# Patient Record
Sex: Female | Born: 1955 | Race: Black or African American | Hispanic: No | Marital: Single | Smoking: Never smoker
Health system: Southern US, Community
[De-identification: ages and names within clinical notes are randomized; demographics above are authoritative.]

## PROBLEM LIST (undated history)

## (undated) DIAGNOSIS — I10 Essential (primary) hypertension: Secondary | ICD-10-CM

## (undated) DIAGNOSIS — E119 Type 2 diabetes mellitus without complications: Secondary | ICD-10-CM

## (undated) DIAGNOSIS — E785 Hyperlipidemia, unspecified: Secondary | ICD-10-CM

## (undated) HISTORY — PX: ABDOMINAL HYSTERECTOMY: SHX81

---

## 2004-04-02 ENCOUNTER — Other Ambulatory Visit: Payer: Self-pay

## 2005-11-11 ENCOUNTER — Ambulatory Visit: Payer: Self-pay | Admitting: Family Medicine

## 2006-07-29 ENCOUNTER — Ambulatory Visit: Payer: Self-pay | Admitting: Family Medicine

## 2007-05-01 ENCOUNTER — Emergency Department: Payer: Self-pay | Admitting: Unknown Physician Specialty

## 2007-05-01 ENCOUNTER — Other Ambulatory Visit: Payer: Self-pay

## 2007-12-07 ENCOUNTER — Emergency Department: Payer: Self-pay | Admitting: Emergency Medicine

## 2008-07-24 ENCOUNTER — Emergency Department: Payer: Self-pay | Admitting: Emergency Medicine

## 2009-04-15 ENCOUNTER — Emergency Department: Payer: Self-pay | Admitting: Emergency Medicine

## 2010-01-01 ENCOUNTER — Emergency Department: Payer: Self-pay | Admitting: Emergency Medicine

## 2011-09-05 ENCOUNTER — Emergency Department: Payer: Self-pay | Admitting: Emergency Medicine

## 2011-09-05 LAB — BASIC METABOLIC PANEL
Anion Gap: 14 (ref 7–16)
Calcium, Total: 8.5 mg/dL (ref 8.5–10.1)
Creatinine: 1.19 mg/dL (ref 0.60–1.30)
EGFR (African American): >60
EGFR (Non-African Amer.): 50 — ABNORMAL LOW
Glucose: 337 mg/dL — ABNORMAL HIGH (ref 65–99)
Osmolality: 297 (ref 275–301)
Sodium: 141 mmol/L (ref 136–145)

## 2011-09-05 LAB — CBC
MCH: 26 pg (ref 26.0–34.0)
MCV: 79 fL — ABNORMAL LOW (ref 80–100)
Platelet: 197 10*3/uL (ref 150–440)
RDW: 15.6 % — ABNORMAL HIGH (ref 11.5–14.5)
WBC: 7.1 10*3/uL (ref 3.6–11.0)

## 2011-09-05 LAB — URINALYSIS, COMPLETE
Bacteria: NONE SEEN
Bilirubin,UR: NEGATIVE
Blood: NEGATIVE
Glucose,UR: >=500 mg/dL (ref 0–75)
Leukocyte Esterase: NEGATIVE
Nitrite: NEGATIVE
Protein: NEGATIVE
RBC,UR: NONE SEEN /HPF (ref 0–5)
Squamous Epithelial: NONE SEEN

## 2011-09-05 LAB — MAGNESIUM: Magnesium: 1.5 mg/dL — ABNORMAL LOW

## 2012-04-04 ENCOUNTER — Ambulatory Visit: Payer: Self-pay | Admitting: Family Medicine

## 2012-08-29 ENCOUNTER — Emergency Department: Payer: Self-pay | Admitting: Emergency Medicine

## 2012-08-29 LAB — URINALYSIS, COMPLETE
Bacteria: NONE SEEN
Bilirubin,UR: NEGATIVE
Blood: NEGATIVE
Ketone: NEGATIVE
Ph: 5 (ref 4.5–8.0)
Protein: NEGATIVE
RBC,UR: <1 /HPF (ref 0–5)
Squamous Epithelial: 1
WBC UR: 1 /HPF (ref 0–5)

## 2013-04-21 ENCOUNTER — Emergency Department: Payer: Self-pay | Admitting: Emergency Medicine

## 2013-08-25 ENCOUNTER — Emergency Department: Payer: Self-pay | Admitting: Emergency Medicine

## 2013-08-27 ENCOUNTER — Ambulatory Visit: Payer: Self-pay | Admitting: Family Medicine

## 2015-05-22 ENCOUNTER — Other Ambulatory Visit: Payer: Self-pay | Admitting: Family Medicine

## 2015-05-22 DIAGNOSIS — Z1231 Encounter for screening mammogram for malignant neoplasm of breast: Secondary | ICD-10-CM

## 2015-06-10 ENCOUNTER — Ambulatory Visit
Admission: RE | Admit: 2015-06-10 | Discharge: 2015-06-10 | Disposition: A | Discharge status: Home / Self Care | Payer: Medicare Other | Source: Ambulatory Visit | Attending: Family Medicine | Admitting: Family Medicine

## 2015-06-10 ENCOUNTER — Other Ambulatory Visit: Payer: Self-pay | Admitting: Family Medicine

## 2015-06-10 DIAGNOSIS — Z1231 Encounter for screening mammogram for malignant neoplasm of breast: Secondary | ICD-10-CM | POA: Insufficient documentation

## 2016-06-29 ENCOUNTER — Other Ambulatory Visit: Payer: Self-pay | Admitting: Family Medicine

## 2016-06-29 DIAGNOSIS — Z1231 Encounter for screening mammogram for malignant neoplasm of breast: Secondary | ICD-10-CM

## 2016-07-12 ENCOUNTER — Ambulatory Visit: Payer: Medicare Other | Attending: Family Medicine

## 2016-11-26 ENCOUNTER — Ambulatory Visit
Admission: RE | Admit: 2016-11-26 | Discharge: 2016-11-26 | Disposition: A | Discharge status: Home / Self Care | Payer: Medicare Other | Source: Ambulatory Visit | Attending: Family Medicine | Admitting: Family Medicine

## 2016-11-26 DIAGNOSIS — Z1231 Encounter for screening mammogram for malignant neoplasm of breast: Secondary | ICD-10-CM | POA: Insufficient documentation

## 2017-12-14 ENCOUNTER — Other Ambulatory Visit: Payer: Self-pay | Admitting: Family Medicine

## 2017-12-14 DIAGNOSIS — Z1231 Encounter for screening mammogram for malignant neoplasm of breast: Secondary | ICD-10-CM

## 2018-01-14 ENCOUNTER — Emergency Department
Admission: EM | Admit: 2018-01-14 | Discharge: 2018-01-14 | Disposition: A | Discharge status: Home / Self Care | Payer: Medicare HMO | Attending: Emergency Medicine | Admitting: Emergency Medicine

## 2018-01-14 ENCOUNTER — Other Ambulatory Visit: Payer: Self-pay

## 2018-01-14 ENCOUNTER — Encounter: Payer: Self-pay | Admitting: Emergency Medicine

## 2018-01-14 DIAGNOSIS — I10 Essential (primary) hypertension: Secondary | ICD-10-CM | POA: Diagnosis not present

## 2018-01-14 DIAGNOSIS — S0501XA Injury of conjunctiva and corneal abrasion without foreign body, right eye, initial encounter: Secondary | ICD-10-CM | POA: Diagnosis not present

## 2018-01-14 DIAGNOSIS — S0591XA Unspecified injury of right eye and orbit, initial encounter: Secondary | ICD-10-CM | POA: Diagnosis present

## 2018-01-14 DIAGNOSIS — E119 Type 2 diabetes mellitus without complications: Secondary | ICD-10-CM | POA: Insufficient documentation

## 2018-01-14 DIAGNOSIS — Y929 Unspecified place or not applicable: Secondary | ICD-10-CM | POA: Diagnosis not present

## 2018-01-14 DIAGNOSIS — Y999 Unspecified external cause status: Secondary | ICD-10-CM | POA: Insufficient documentation

## 2018-01-14 DIAGNOSIS — X58XXXA Exposure to other specified factors, initial encounter: Secondary | ICD-10-CM | POA: Diagnosis not present

## 2018-01-14 DIAGNOSIS — Y939 Activity, unspecified: Secondary | ICD-10-CM | POA: Diagnosis not present

## 2018-01-14 HISTORY — DX: Type 2 diabetes mellitus without complications: E11.9

## 2018-01-14 HISTORY — DX: Essential (primary) hypertension: I10

## 2018-01-14 HISTORY — DX: Hyperlipidemia, unspecified: E78.5

## 2018-01-14 MED ORDER — TOBRAMYCIN 0.3 % OP SOLN: 2.0000 [drp] | OPHTHALMIC | 0 refills | Status: DC

## 2018-01-14 MED ORDER — FLUORESCEIN SODIUM 1 MG OP STRP
1.0000 | ORAL_STRIP | Freq: Once | OPHTHALMIC | Status: DC
  Filled 2018-01-14: qty 1

## 2018-01-14 MED ORDER — TOBRAMYCIN 0.3 % OP SOLN: 2.0000 [drp] | OPHTHALMIC | 0 refills | Status: AC

## 2018-01-14 MED ORDER — TETRACAINE HCL 0.5 % OP SOLN
1.0000 [drp] | Freq: Once | OPHTHALMIC | Status: DC
  Filled 2018-01-14: qty 4

## 2018-01-14 NOTE — ED Triage Notes (Signed)
Pt to ED via POV pt states that she accidentally hit herself in the right eye on Wednesday with a piece of cardboard. Yesterday pt started having pain and redness in her eye. Pt states that eye is sensitive to light. Pt is in NAD at this time.

## 2018-01-14 NOTE — ED Notes (Signed)
Pt states swatted at a bug that landed near her eye and scratched her outer lid a few days ago. Now her eye is red and painful

## 2018-01-14 NOTE — ED Notes (Signed)
Pt not present in room when attempting to obtain discharge vitals.

## 2018-01-14 NOTE — Discharge Instructions (Addendum)
Begin using tobramycin ophthalmic solution to your right eye every 4 hours while awake.  You may take Tylenol if needed for eye pain.  Follow-up with Dr. Sharman CrateBrassington if any continued problems.  Call their office on Monday for an appointment.

## 2018-01-14 NOTE — ED Notes (Signed)
Pt ambulatory upon discharge; declined wheel chair. Verbalized understanding of discharge instructions, follow-up care and prescription. VSS. Skin warm and dry. A&O x4.  

## 2018-01-14 NOTE — ED Provider Notes (Signed)
[facility] Emergency Department Provider Note  ___________________________________________   First MD Initiated Contact with Patient 01/14/18 1422     (approximate)  I have reviewed the triage vital signs and the nursing notes.   HISTORY  Chief Complaint Eye Pain   HPI Melissa Gallagher is a 62 y.o. female is here with complaint of right eye pain.  Patient states that there was a bug that flew past her and she was using a piece of cardboard to fan herself.  Patient states she felt something in her eye.  She has continued to get some redness and now has some photophobia.  She rates her pain as 7 out of 10.  Past Medical History:  Diagnosis Date  . Diabetes mellitus without complication (HCC)   . Hyperlipemia   . Hypertension     There are no active problems to display for this patient.   Past Surgical History:  Procedure Laterality Date  . ABDOMINAL HYSTERECTOMY      Prior to Admission medications   Medication Sig Start Date End Date Taking? Authorizing Provider  tobramycin (TOBREX) 0.3 % ophthalmic solution Place 2 drops into the right eye every 4 (four) hours. While awake 01/14/18   Tommi Rumps, PA-C    Allergies Patient has no known allergies.  Family History  Problem Relation Age of Onset  . Breast cancer Cousin     Social History Social History   Tobacco Use  . Smoking status: Never Smoker  . Smokeless tobacco: Never Used  Substance Use Topics  . Alcohol use: Not Currently  . Drug use: Not Currently    Review of Systems Constitutional: No fever/chills Eyes: Right eye pain.  Positive for possible injury to right eye. ENT: No sore throat. Cardiovascular: Denies chest pain. Respiratory: Denies shortness of breath. Musculoskeletal: Negative for back pain. Skin: Negative for rash. Neurological: Negative for headaches, focal weakness or numbness. ____________________________________________   PHYSICAL EXAM:  VITAL  SIGNS: ED Triage Vitals  Enc Vitals Group     BP 01/14/18 1344 (!) 150/83     Pulse Rate 01/14/18 1344 86     Resp 01/14/18 1344 16     Temp 01/14/18 1344 98.6 F (37 C)     Temp Source 01/14/18 1344 Oral     SpO2 01/14/18 1344 99 %     Weight 01/14/18 1345 260 lb (117.9 kg)     Height 01/14/18 1345 5\' 7"  (1.702 m)     Head Circumference --      Peak Flow --      Pain Score 01/14/18 1344 7     Pain Loc --      Pain Edu? --      Excl. in GC? --    Constitutional: Alert and oriented. Well appearing and in no acute distress. Eye:  PERRL. EOMI. right conjunctiva is injected.  Left conjunctive a clear. Head: Atraumatic. Neck: No stridor.   Cardiovascular: Normal rate, regular rhythm. Grossly normal heart sounds.  Good peripheral circulation. Respiratory: Normal respiratory effort.  No retractions. Lungs CTAB. Neurologic:  Normal speech and language. No gross focal neurologic deficits are appreciated. No gait instability. Skin:  Skin is warm, dry and intact.  Psychiatric: Mood and affect are normal. Speech and behavior are normal.  ____________________________________________   LABS (all labs ordered are listed, but only abnormal results are displayed)  Labs Reviewed - No data to display  PROCEDURES  Procedure(s) performed: Tetracaine was placed to the right eye.  Upper lid was inverted and no foreign body was noted.  No foreign body is noted and EOMs are intact.  Fluorescein stain was placed and there is a superficial corneal abrasion present at approximately 6:00.  Visual acuity was done by nursing staff.  Procedures  Critical Care performed: No  ____________________________________________   INITIAL IMPRESSION / ASSESSMENT AND PLAN / ED COURSE  As part of my medical decision making, I reviewed the following data within the electronic MEDICAL RECORD NUMBER Notes from prior ED visits and [place] Controlled Substance Database  Patient is here with photophobia after swatting at a  bug with a piece of cardboard.  Exam reveals superficial corneal abrasion.  Patient was placed on Tobrex ophthalmic solution.  She is encouraged to follow-up with [facility] if any continued problems on Monday.  ____________________________________________   FINAL CLINICAL IMPRESSION(S) / ED DIAGNOSES  Final diagnoses:  Abrasion of right cornea, initial encounter     ED Discharge Orders        Ordered    tobramycin (TOBREX) 0.3 % ophthalmic solution  Every 4 hours,   Status:  Discontinued     01/14/18 1445    tobramycin (TOBREX) 0.3 % ophthalmic solution  Every 4 hours     01/14/18 1447       Note:  This document was prepared using Dragon voice recognition software and may include unintentional dictation errors.    Tommi RumpsSummers, Kaysia Willard L, PA-C 01/14/18 1450    Jene EveryKinner, Robert, MD 01/14/18 1454

## 2018-03-02 ENCOUNTER — Ambulatory Visit
Admission: RE | Admit: 2018-03-02 | Discharge: 2018-03-02 | Disposition: A | Discharge status: Home / Self Care | Payer: Medicare HMO | Source: Ambulatory Visit | Attending: Family Medicine | Admitting: Family Medicine

## 2018-03-02 DIAGNOSIS — Z1231 Encounter for screening mammogram for malignant neoplasm of breast: Secondary | ICD-10-CM | POA: Insufficient documentation

## 2019-04-26 ENCOUNTER — Other Ambulatory Visit: Payer: Self-pay | Admitting: Family Medicine

## 2019-04-26 DIAGNOSIS — Z1231 Encounter for screening mammogram for malignant neoplasm of breast: Secondary | ICD-10-CM

## 2019-06-04 ENCOUNTER — Ambulatory Visit
Admission: RE | Admit: 2019-06-04 | Discharge: 2019-06-04 | Disposition: A | Discharge status: Home / Self Care | Payer: Medicare HMO | Source: Ambulatory Visit | Attending: Family Medicine | Admitting: Family Medicine

## 2019-06-04 DIAGNOSIS — Z1231 Encounter for screening mammogram for malignant neoplasm of breast: Secondary | ICD-10-CM

## 2019-07-06 IMAGING — MG MM DIGITAL SCREENING BILAT W/ TOMO W/ CAD
8 series · 8 of 24 positions shown · non-contrast
Comparison: Previous exam(s).

CLINICAL DATA: Screening.

EXAM:
DIGITAL SCREENING BILATERAL MAMMOGRAM WITH TOMO AND CAD

[L CC synth-2D]
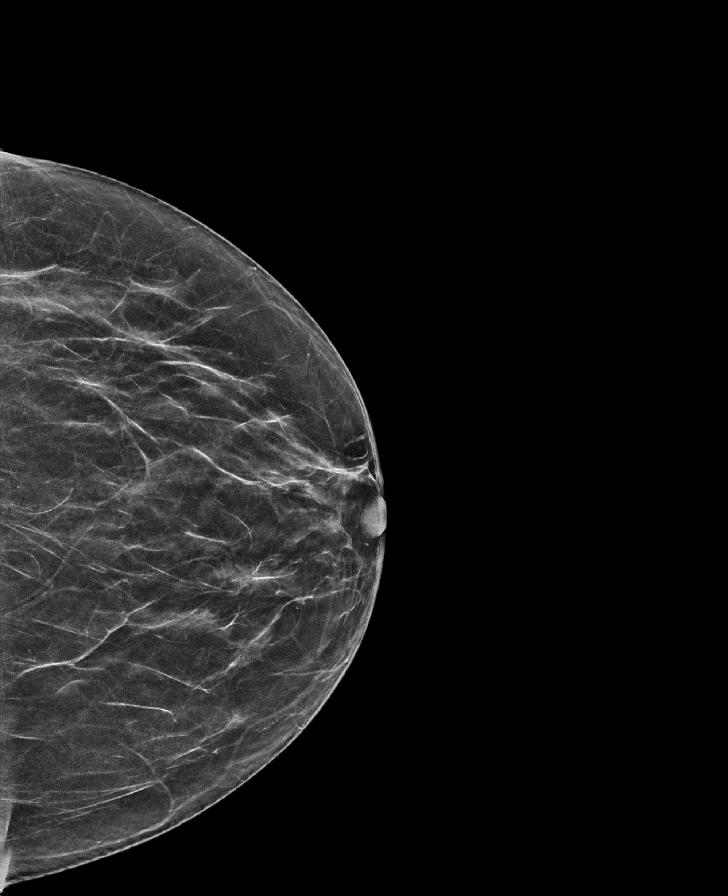

[R CC synth-2D]
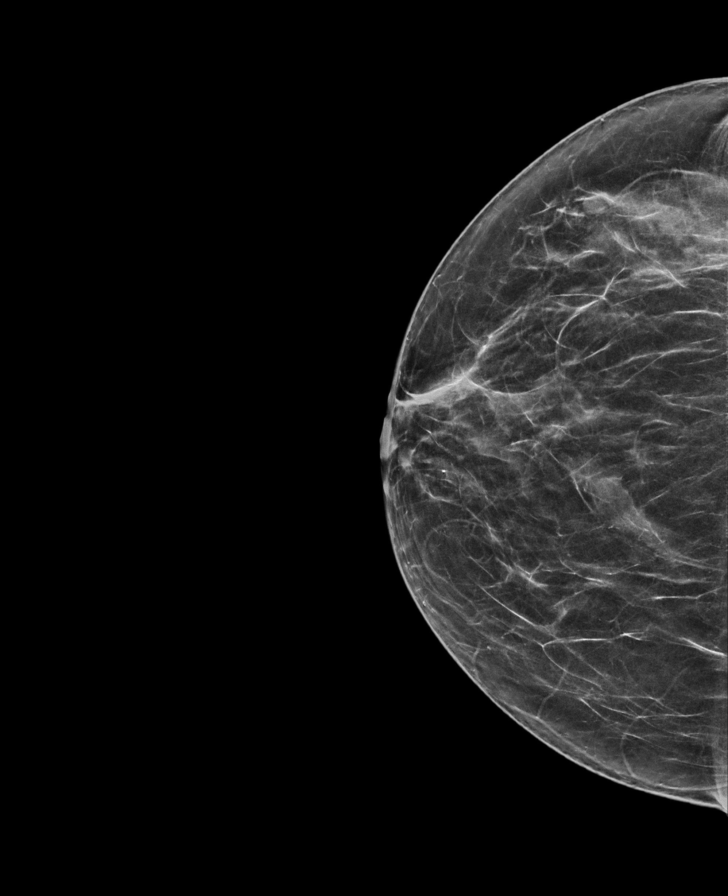

[L MLO synth-2D]
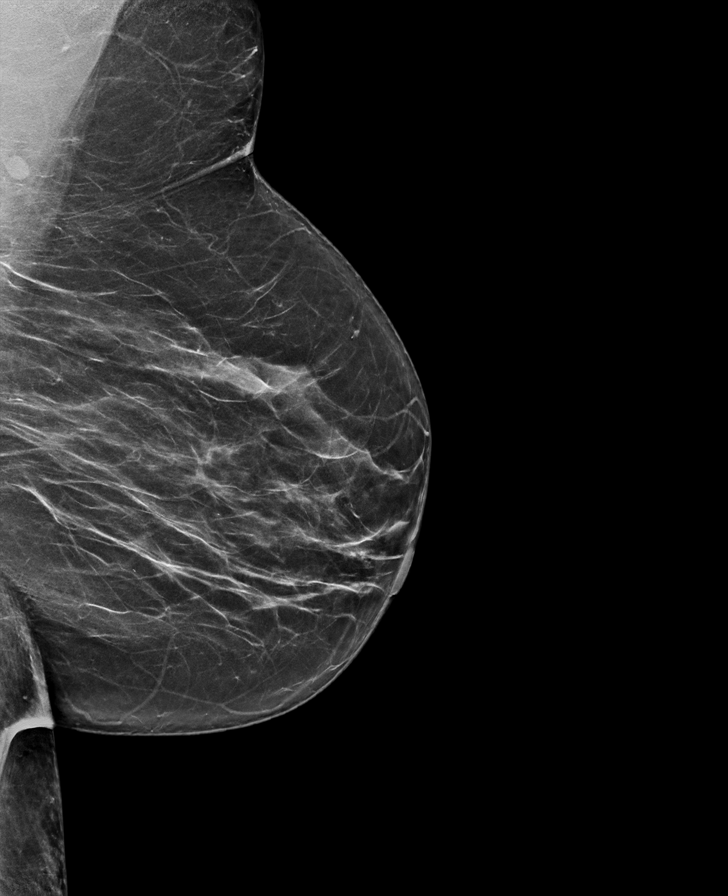

[R MLO synth-2D]
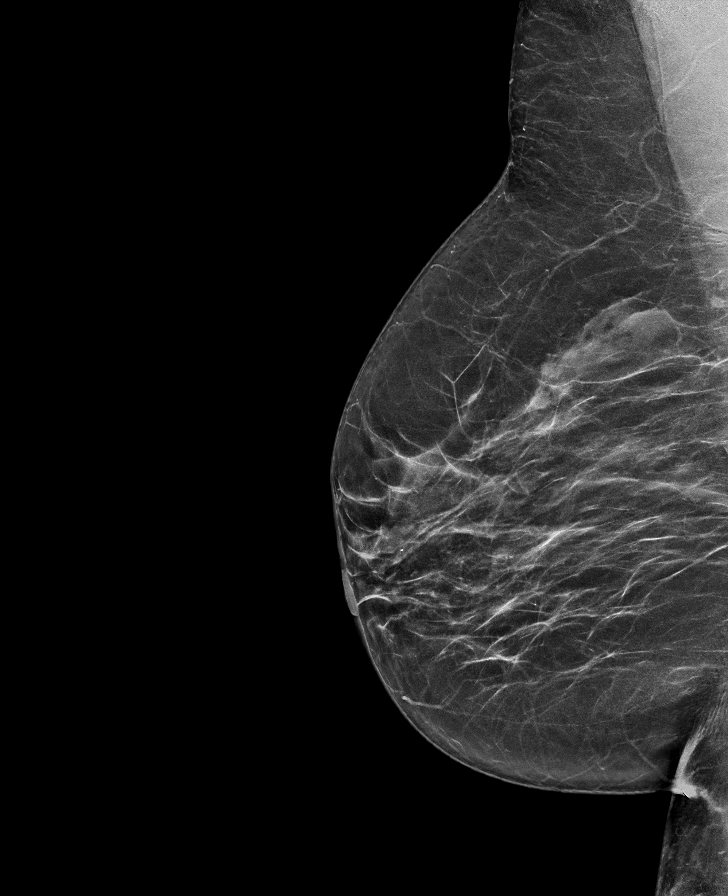

[L CC tomo · tomo slice 29/57.0]
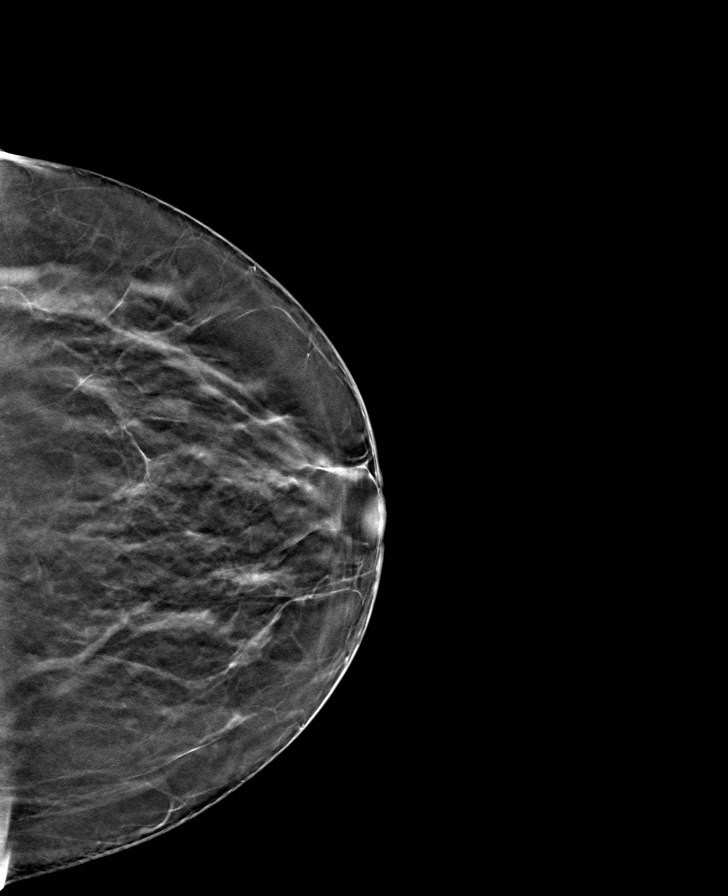

[R CC tomo · tomo slice 31/60.0]
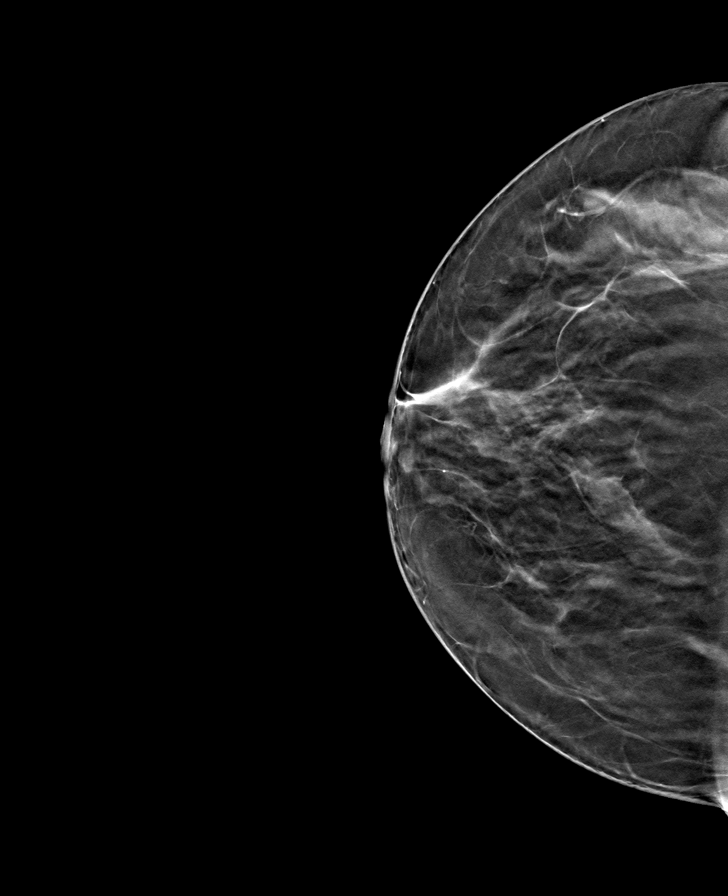

[L MLO tomo · tomo slice 35/68.0]
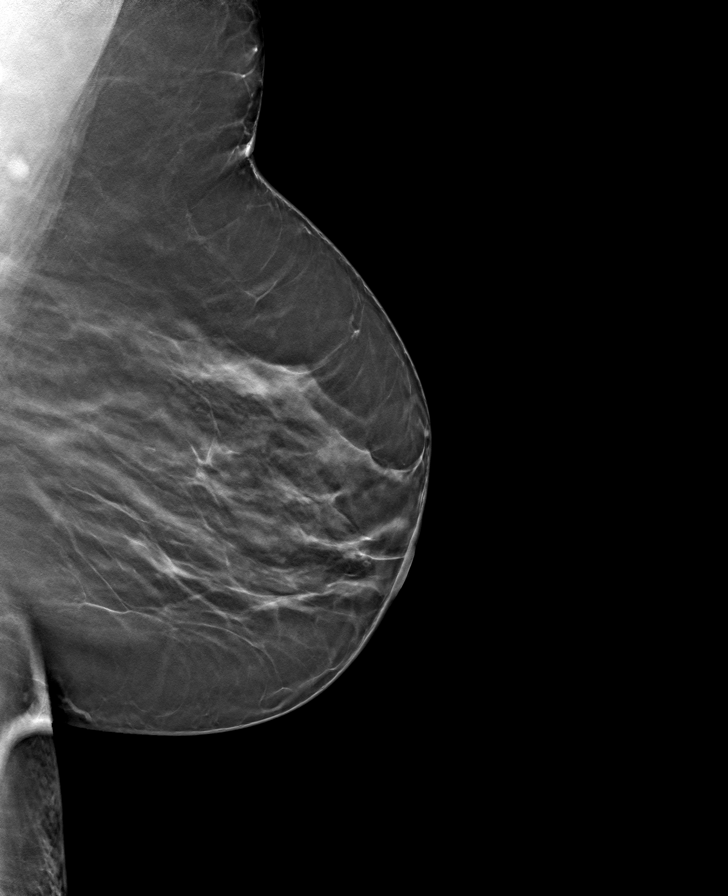

[R MLO tomo · tomo slice 33/66.0]
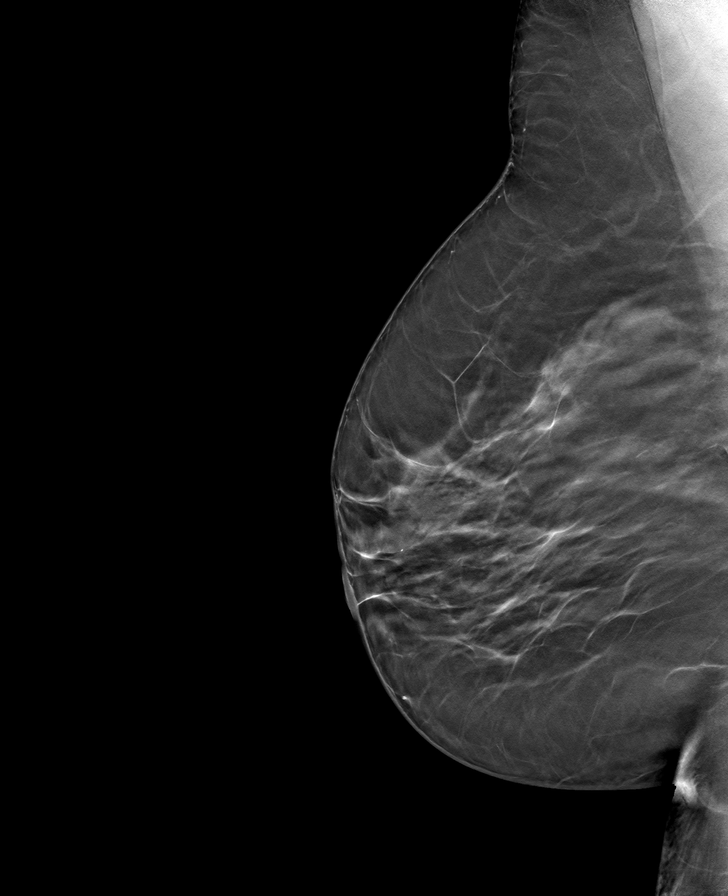

[8 of 24 positions shown; findings below may reference images not displayed]

ACR Breast Density Category b: There are scattered areas of
fibroglandular density.
FINDINGS: There are no findings suspicious for malignancy. Images were
processed with CAD.
IMPRESSION: No mammographic evidence of malignancy. A result letter of this
screening mammogram will be mailed directly to the patient.

RECOMMENDATION:
Screening mammogram in one year. (Code:[id number])

BI-RADS CATEGORY  1: Negative.

## 2021-03-24 ENCOUNTER — Other Ambulatory Visit: Payer: Self-pay | Admitting: Family Medicine

## 2021-03-24 DIAGNOSIS — Z1231 Encounter for screening mammogram for malignant neoplasm of breast: Secondary | ICD-10-CM

## 2021-08-13 ENCOUNTER — Encounter: Payer: Self-pay | Admitting: Emergency Medicine

## 2021-08-13 ENCOUNTER — Emergency Department
Admission: EM | Admit: 2021-08-13 | Discharge: 2021-08-13 | Disposition: A | Discharge status: Home / Self Care | Payer: Medicare Other | Attending: Emergency Medicine | Admitting: Emergency Medicine

## 2021-08-13 ENCOUNTER — Other Ambulatory Visit: Payer: Self-pay

## 2021-08-13 ENCOUNTER — Emergency Department: Payer: Medicare Other

## 2021-08-13 DIAGNOSIS — I10 Essential (primary) hypertension: Secondary | ICD-10-CM | POA: Insufficient documentation

## 2021-08-13 DIAGNOSIS — M25512 Pain in left shoulder: Secondary | ICD-10-CM | POA: Diagnosis present

## 2021-08-13 DIAGNOSIS — E119 Type 2 diabetes mellitus without complications: Secondary | ICD-10-CM | POA: Diagnosis not present

## 2021-08-13 DIAGNOSIS — M79602 Pain in left arm: Secondary | ICD-10-CM

## 2021-08-13 MED ORDER — KETOROLAC TROMETHAMINE 30 MG/ML IJ SOLN
30.0000 mg | Freq: Once | INTRAMUSCULAR | Status: AC
  Administered 2021-08-13: 30 mg via INTRAMUSCULAR
  Filled 2021-08-13: qty 1

## 2021-08-13 MED ORDER — ETODOLAC 400 MG PO TABS: 400.0000 mg | ORAL_TABLET | Freq: Two times a day (BID) | ORAL | 0 refills | Status: AC

## 2021-08-13 MED ORDER — METHOCARBAMOL 500 MG PO TABS: 500.0000 mg | ORAL_TABLET | Freq: Three times a day (TID) | ORAL | 0 refills | Status: AC

## 2021-08-13 NOTE — Discharge Instructions (Signed)
Follow-up with your primary care provider if any continued problems or concerns.  A prescription for etodolac 400 mg is twice a day with food for inflammation and pain.  This medication will not cause drowsiness.  The methocarbamol 500 g can be taken every 8 hours however do not take this medication and drive or operate machinery as it can cause drowsiness.  It may be necessary that if you are doing something in the home you will need to take this medication before you go to bed as this will decrease your risk for falling if it causes drowsiness.  You may use ice or heat to your back as needed for discomfort.  You may continue taking Tylenol with this medication if additional pain medication is needed.

## 2021-08-13 NOTE — ED Triage Notes (Addendum)
Pt arrived via POV with c/o L upper back pain , pt states moving her L arm causes back pain. Pt denies any injury, pt states the pain as been going on for several weeks. Pt states the pain has gotten better, but then returned Monday and was worse during the night affecting her sleep.  Pt thinks she has been overusing the L side.

## 2021-08-13 NOTE — ED Provider Notes (Signed)
[facility] Provider Note    Event Date/Time   First MD Initiated Contact with Patient 08/13/21 [phone]     (approximate)   History   Back Pain   HPI  Melissa Gallagher is a 66 y.o. female presents to the ED with complaint of left posterior shoulder pain for several weeks.  Patient states that the pain got better and then returned this past week.  Patient states that the pain is worse with movement of her left arm.  She states that she was unable to lay on her left side last night due to increased pain in her shoulder.  Patient has been taking Tylenol without any relief.  Patient has a history of hypertension, hyperlipidemia and diabetes.  She denies any previous cardiac history, nausea, vomiting, shortness of breath or indigestion.     Physical Exam   Triage Vital Signs: ED Triage Vitals  Enc Vitals Group     BP 08/13/21 0455 (!) 153/86     Pulse Rate 08/13/21 0455 81     Resp 08/13/21 0455 18     Temp 08/13/21 0455 98.6 F (37 C)     Temp Source 08/13/21 0455 Oral     SpO2 08/13/21 0455 97 %     Weight 08/13/21 0456 265 lb (120.2 kg)     Height 08/13/21 0456 5\' 8"  (1.727 m)     Head Circumference --      Peak Flow --      Pain Score 08/13/21 0455 10     Pain Loc --      Pain Edu? --      Excl. in GC? --     Most recent vital signs: Vitals:   08/13/21 0455  BP: (!) 153/86  Pulse: 81  Resp: 18  Temp: 98.6 F (37 C)  SpO2: 97%     General: Awake, no distress.  CV:  Good peripheral perfusion.  Regular rate and rhythm without murmur. Resp:  Normal effort.  Lungs are clear bilaterally. Abd:  No distention.  Other:  Examination of the left shoulder and back is negative for any gross deformity.  There is generalized tenderness on palpation especially the left parascapular muscles.  Range of motion is slow and guarded secondary to increased pain.  There is no crepitus with range of motion in the joint.  Skin is intact.  No erythema or abrasions  are seen.  Good muscle strength bilaterally at 5/5.   ED Results / Procedures / Treatments   Labs (all labs ordered are listed, but only abnormal results are displayed) Labs Reviewed - No data to display    RADIOLOGY Left shoulder x-ray images reviewed by myself is negative for any acute bony injuries.  Theology report was also reviewed and negative for acute change.   PROCEDURES:  Critical Care performed: No  Procedures   MEDICATIONS ORDERED IN ED: Medications  ketorolac (TORADOL) 30 MG/ML injection 30 mg (30 mg Intramuscular Given 08/13/21 0738)     IMPRESSION / MDM / ASSESSMENT AND PLAN / ED COURSE  I reviewed the triage vital signs and the nursing notes.                              Differential diagnosis includes, but is not limited to, acute left shoulder pain, left shoulder strain, degenerative joint disease, musculoskeletal strain.  66 year old female presents to the ED with complaint of left shoulder pain  without history of injury.  Patient states that pain is increased with range of motion.  There is moderate tenderness on palpation of the parascapular muscles on the medial aspect but no gross deformity or crepitus with range of motion.  X-ray left shoulder was reviewed by myself and no acute bony changes were noted and radiology report agrees.  Patient was made aware that the images were negative and an injection of Toradol 30 mg IM was given to her while in the emergency department.  She was discharged with a prescription for methocarbamol to take at bedtime or 3 times a day if she does not plan on going anywhere as it could cause drowsiness.  Etodolac 400 mg twice daily with food.  She is to follow-up with her PCP if not improving.  She is return to the emergency department if any severe worsening of her symptoms.      FINAL CLINICAL IMPRESSION(S) / ED DIAGNOSES   Final diagnoses:  Musculoskeletal pain of upper extremity, left     Rx / DC Orders   ED  Discharge Orders          Ordered    methocarbamol (ROBAXIN) 500 MG tablet  3 times daily        08/13/21 0827    etodolac (LODINE) 400 MG tablet  2 times daily        08/13/21 0827             Note:  This document was prepared using Dragon voice recognition software and may include unintentional dictation errors.   Tommi Rumps, PA-C 08/13/21 1558    [place]ton [place], MD 08/14/21 [phone]

## 2021-08-13 NOTE — ED Notes (Signed)
See triage note  presents with pain to posterior right shoulder  denies any cough ,fever or injury  afebrile on arrival increased pain with movement

## 2021-11-05 ENCOUNTER — Other Ambulatory Visit: Payer: Self-pay | Admitting: Student

## 2021-11-05 DIAGNOSIS — Z1231 Encounter for screening mammogram for malignant neoplasm of breast: Secondary | ICD-10-CM

## 2021-11-27 ENCOUNTER — Other Ambulatory Visit: Payer: Self-pay | Admitting: Student

## 2021-11-27 ENCOUNTER — Ambulatory Visit
Admission: RE | Admit: 2021-11-27 | Discharge: 2021-11-27 | Disposition: A | Discharge status: Home / Self Care | Payer: Medicare Other | Attending: *Deleted | Admitting: *Deleted

## 2021-11-27 DIAGNOSIS — M25572 Pain in left ankle and joints of left foot: Secondary | ICD-10-CM

## 2022-06-08 ENCOUNTER — Ambulatory Visit
Admission: RE | Admit: 2022-06-08 | Discharge: 2022-06-08 | Disposition: A | Discharge status: Home / Self Care | Payer: Medicare Other | Source: Ambulatory Visit | Attending: Obstetrics and Gynecology | Admitting: Obstetrics and Gynecology

## 2022-06-08 DIAGNOSIS — Z1231 Encounter for screening mammogram for malignant neoplasm of breast: Secondary | ICD-10-CM | POA: Diagnosis not present
